# Patient Record
Sex: Male | Born: 1970 | Race: White | Hispanic: No | Marital: Single
Health system: Southern US, Community
[De-identification: ages and names within clinical notes are randomized; demographics above are authoritative.]

---

## 2016-06-12 DIAGNOSIS — Y929 Unspecified place or not applicable: Secondary | ICD-10-CM | POA: Insufficient documentation

## 2016-06-12 DIAGNOSIS — S4991XA Unspecified injury of right shoulder and upper arm, initial encounter: Secondary | ICD-10-CM | POA: Diagnosis present

## 2016-06-12 DIAGNOSIS — Y9389 Activity, other specified: Secondary | ICD-10-CM | POA: Insufficient documentation

## 2016-06-12 DIAGNOSIS — S42032A Displaced fracture of lateral end of left clavicle, initial encounter for closed fracture: Secondary | ICD-10-CM | POA: Insufficient documentation

## 2016-06-12 DIAGNOSIS — Y999 Unspecified external cause status: Secondary | ICD-10-CM | POA: Insufficient documentation

## 2016-06-13 ENCOUNTER — Emergency Department
Admission: EM | Admit: 2016-06-13 | Discharge: 2016-06-13 | Disposition: A | Discharge status: Home / Self Care | Payer: BLUE CROSS/BLUE SHIELD | Attending: Emergency Medicine | Admitting: Emergency Medicine

## 2016-06-13 ENCOUNTER — Emergency Department: Payer: BLUE CROSS/BLUE SHIELD

## 2016-06-13 DIAGNOSIS — S42001A Fracture of unspecified part of right clavicle, initial encounter for closed fracture: Secondary | ICD-10-CM

## 2016-06-13 DIAGNOSIS — M958 Other specified acquired deformities of musculoskeletal system: Secondary | ICD-10-CM

## 2016-06-13 MED ORDER — OXYCODONE-ACETAMINOPHEN 5-325 MG PO TABS
1.0000 | ORAL_TABLET | Freq: Once | ORAL | Status: AC
  Administered 2016-06-13: 1 via ORAL
  Filled 2016-06-13: qty 1

## 2016-06-13 MED ORDER — IBUPROFEN 600 MG PO TABS
600.0000 mg | ORAL_TABLET | Freq: Once | ORAL | Status: AC
  Administered 2016-06-13: 600 mg via ORAL
  Filled 2016-06-13: qty 1

## 2016-06-13 MED ORDER — OXYCODONE-ACETAMINOPHEN 5-325 MG PO TABS: 1.0000 | ORAL_TABLET | Freq: Four times a day (QID) | ORAL | Status: AC | PRN
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
## 2016-06-13 NOTE — Discharge Instructions (Signed)
You were prescribed a medication that is potentially sedating. Do not drink alcohol, drive or participate in any other potentially dangerous activities while taking this medication as it may make you sleepy. Do not take this medication with any other sedating medications, either prescription or over-the-counter. If you were prescribed Percocet or Vicodin, do not take these with acetaminophen (Tylenol) as it is already contained within these medications.   Opioid pain medications (or "narcotics") can be habit forming.  Use it as little as possible to achieve adequate pain control.  Do not use or use it with extreme caution if you have a history of opiate abuse or dependence.  If you are on a pain contract with your primary care doctor or a pain specialist, be sure to let them know you were prescribed this medication today from the [facility].  This medication is intended for your use only - do not give any to anyone else and keep it in a secure place where nobody else, especially children and pets, have access to it.  It will also cause or worsen constipation, so you may want to consider taking an over-the-counter stool softener while you are taking this medication.  Clavicle Fracture The clavicle, also called the collarbone, is the long bone that connects your shoulder to your rib cage. You can feel your collarbone at the top of your shoulders and rib cage. A clavicle fracture is a broken clavicle. It is a common injury that can happen at any age.  CAUSES Common causes of a clavicle fracture include:  A direct blow to your shoulder.  A car accident.  A fall, especially if you try to break your fall with an outstretched arm. RISK FACTORS You may be at increased risk if:  You are younger than 25 years or older than 75 years. Most clavicle fractures happen to people who are younger than 25 years.  You are a male.  You play contact sports. SIGNS AND SYMPTOMS A fractured  clavicle is painful. It also makes it hard to move your arm. Other signs and symptoms may include:  A shoulder that drops downward and forward.  Pain when trying to lift your shoulder.  Bruising, swelling, and tenderness over your clavicle.  A grinding noise when you try to move your shoulder.  A bump over your clavicle. DIAGNOSIS Your health care provider can usually diagnose a clavicle fracture by asking about your injury and examining your shoulder and clavicle. He or she may take an X-ray to determine the position of your clavicle. TREATMENT Treatment depends on the position of your clavicle after the fracture:  If the broken ends of the bone are not out of place, your health care provider may put your arm in a sling or wrap a support bandage around your chest (figure-of-eight wrap).  If the broken ends of the bone are out of place, you may need surgery. Surgery may involve placing screws, pins, or plates to keep your clavicle stable while it heals. Healing may take about 3 months. When your health care provider thinks your fracture has healed enough, you may have to do physical therapy to regain normal movement and build up your arm strength. HOME CARE INSTRUCTIONS   Apply ice to the injured area:  Put ice in a plastic bag.  Place a towel between your skin and the bag.  Leave the ice on for 20 minutes, 2-3 times a day.  If you have a wrap or splint:  Wear  it all the time, and remove it only to take a bath or shower.  When you bathe or shower, keep your shoulder in the same position as when the sling or wrap is on.  Do not lift your arm.  If you have a figure-of-eight wrap:  Another person must tighten it every day.  It should be tight enough to hold your shoulders back.  Allow enough room to place your index finger between your body and the strap.  Loosen the wrap immediately if you feel numbness or tingling in your hands.  Only take medicines as directed by your  health care provider.  Avoid activities that make the injury or pain worse for 4-6 weeks after surgery.  Keep all follow-up appointments. SEEK MEDICAL CARE IF:  Your medicine is not helping to relieve pain and swelling. SEEK IMMEDIATE MEDICAL CARE IF:  Your arm is numb, cold, or pale, even when the splint is loose. MAKE SURE YOU:   Understand these instructions.  Will watch your condition.  Will get help right away if you are not doing well or get worse.   This information is not intended to replace advice given to you by your health care provider. Make sure you discuss any questions you have with your health care provider.   Document Released: 09/15/2005 Document Revised: 12/11/2013 Document Reviewed: 10/29/2013 Elsevier Interactive Patient Education [employer]2016 [employer]
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
## 2018-01-05 IMAGING — CR DG CLAVICLE*R*
1 series · 2 of 2 positions shown · non-contrast
Comparison: None.

CLINICAL DATA: 45-year-old male with right shoulder deformity and
pain.

EXAM:
RIGHT CLAVICLE - 2+ VIEWS

[Series 1: dg clavicle right · 0.14mm/px · 2 of 2 slices shown]
[im 1/2]
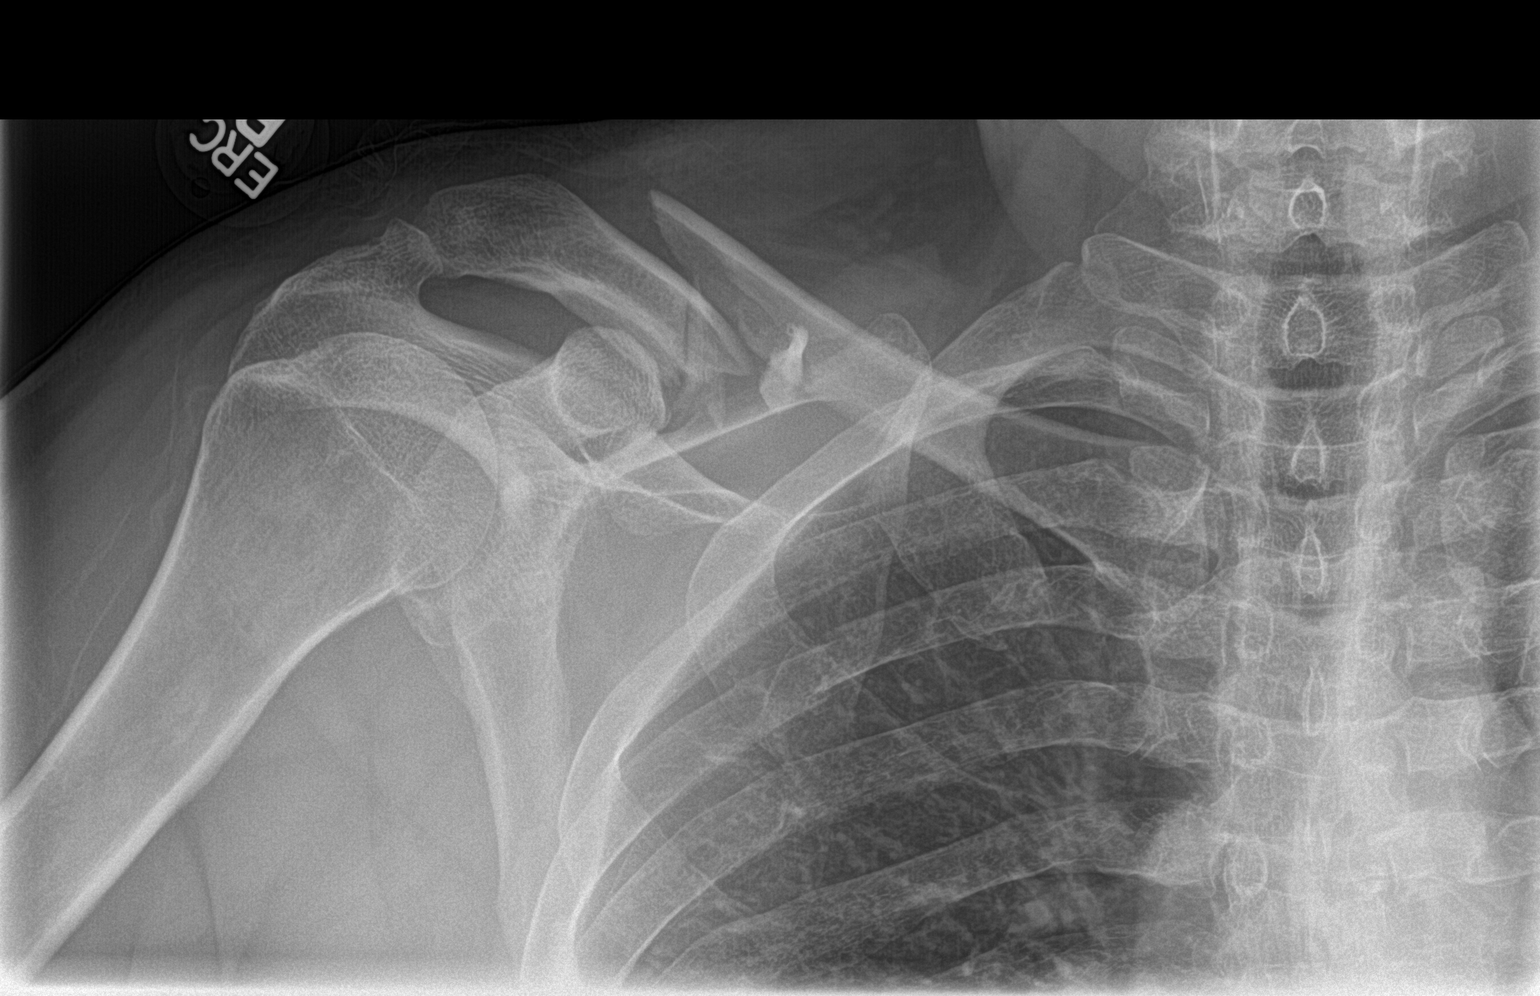
[im 2/2]
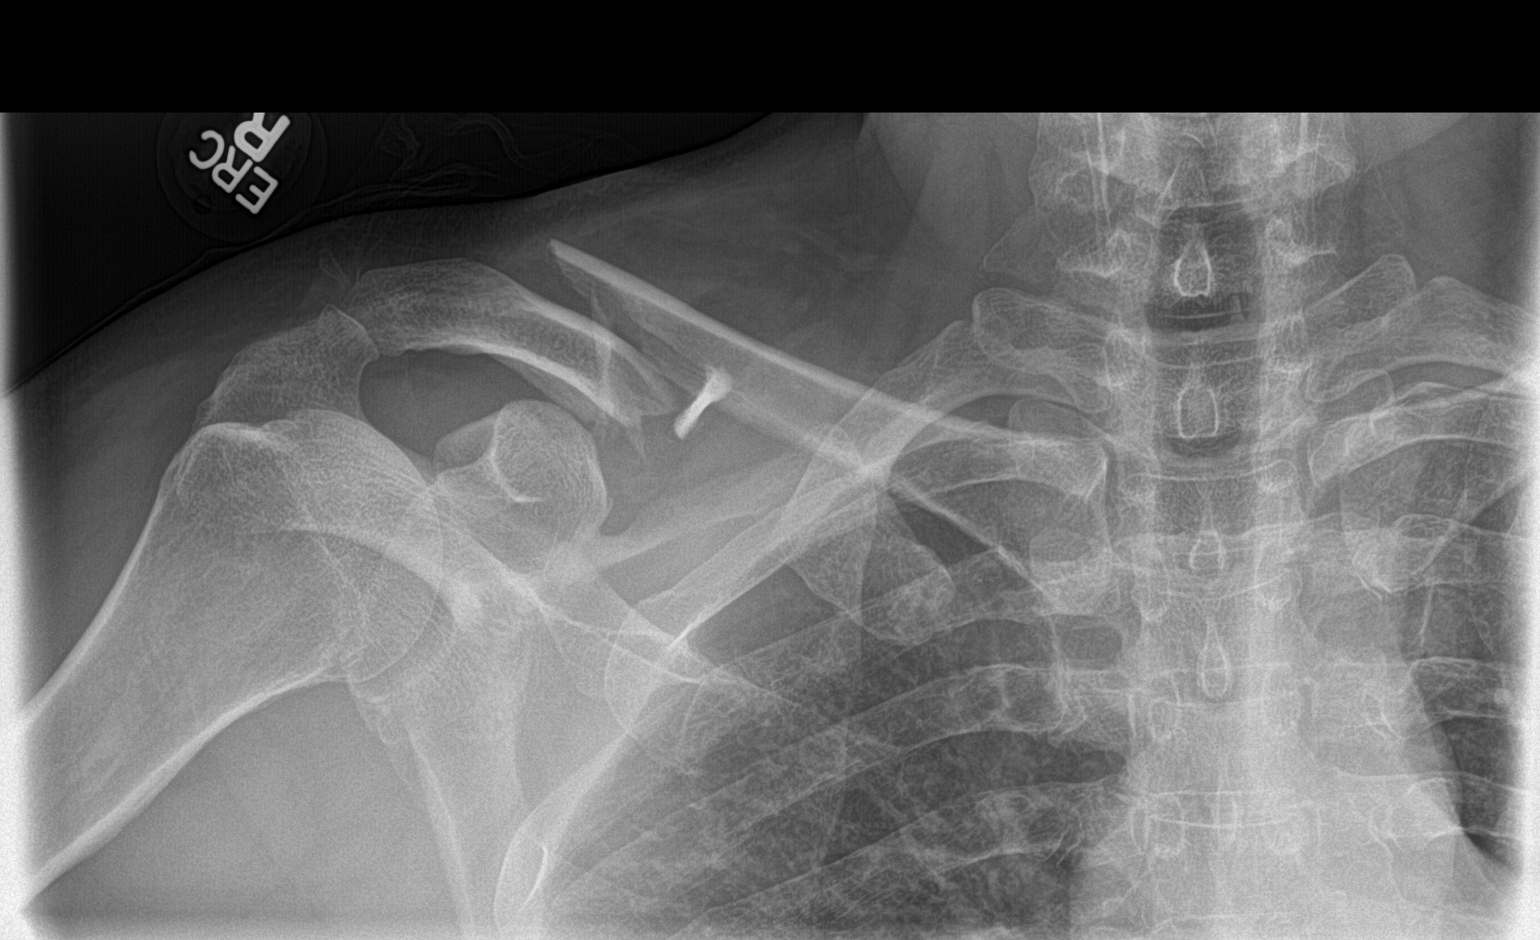

[2 of 2 positions shown; findings below may reference images not displayed]

FINDINGS: There is comminuted appearing and displaced fracture of the distal
third of the right clavicle with approximately 1 cm cul displacement
of the distal fracture fragment. The acromioclavicular articulation
remains intact. There is no fracture or dislocation of the right
shoulder. The soft tissues are grossly unremarkable. No radiopaque
foreign object identified.
IMPRESSION: Displaced comminuted fracture of the distal third of the right
clavicle.
